# Patient Record
Sex: Male | Born: 2000 | Race: White | Hispanic: No | Marital: Single | Smoking: Never smoker
Health system: Southern US, Community
[De-identification: ages and names within clinical notes are randomized; demographics above are authoritative.]

## PROBLEM LIST (undated history)

## (undated) DIAGNOSIS — F909 Attention-deficit hyperactivity disorder, unspecified type: Secondary | ICD-10-CM

## (undated) DIAGNOSIS — R480 Dyslexia and alexia: Secondary | ICD-10-CM

---
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
## 2018-05-08 ENCOUNTER — Emergency Department (HOSPITAL_COMMUNITY): Payer: Managed Care, Other (non HMO)

## 2018-05-08 ENCOUNTER — Encounter (HOSPITAL_COMMUNITY): Payer: Self-pay

## 2018-05-08 ENCOUNTER — Emergency Department (HOSPITAL_COMMUNITY)
Admission: EM | Admit: 2018-05-08 | Discharge: 2018-05-08 | Disposition: A | Discharge status: Home / Self Care | Payer: Managed Care, Other (non HMO) | Attending: Emergency Medicine | Admitting: Emergency Medicine

## 2018-05-08 DIAGNOSIS — Z79899 Other long term (current) drug therapy: Secondary | ICD-10-CM | POA: Insufficient documentation

## 2018-05-08 DIAGNOSIS — X501XXA Overexertion from prolonged static or awkward postures, initial encounter: Secondary | ICD-10-CM | POA: Insufficient documentation

## 2018-05-08 DIAGNOSIS — S93401A Sprain of unspecified ligament of right ankle, initial encounter: Secondary | ICD-10-CM

## 2018-05-08 DIAGNOSIS — Y9351 Activity, roller skating (inline) and skateboarding: Secondary | ICD-10-CM | POA: Insufficient documentation

## 2018-05-08 DIAGNOSIS — Y999 Unspecified external cause status: Secondary | ICD-10-CM | POA: Insufficient documentation

## 2018-05-08 DIAGNOSIS — Y929 Unspecified place or not applicable: Secondary | ICD-10-CM | POA: Insufficient documentation

## 2018-05-08 DIAGNOSIS — F909 Attention-deficit hyperactivity disorder, unspecified type: Secondary | ICD-10-CM | POA: Diagnosis not present

## 2018-05-08 MED ORDER — IBUPROFEN 600 MG PO TABS: 600.0000 mg | ORAL_TABLET | Freq: Three times a day (TID) | ORAL | 0 refills | Status: AC | PRN

## 2018-05-08 MED ORDER — ACETAMINOPHEN 500 MG PO TABS
1000.0000 mg | ORAL_TABLET | Freq: Once | ORAL | Status: AC
  Administered 2018-05-08: 1000 mg via ORAL
  Filled 2018-05-08: qty 2

## 2018-05-08 NOTE — ED Provider Notes (Signed)
[facility]   CSN: [id number] Arrival date & time: 05/08/18  1217     History   Chief Complaint Chief Complaint  Patient presents with  . Ankle Injury    HPI Curlene DolphinDaniel Ostrander is a 17 y.o. male.  HPI  Patient is a 17 year old male with a history of ADD and dyslexia presenting for right ankle injury.  He reports that last night he was skateboarding and inverted his ankle.  He reports he has been minimally walking on it ever since.  He reports that the lateral aspect of his right ankle has been swollen and ecchymotic ever since injury.  He reports that he has pain with moving all of the toes of his right foot.  Denies any numbness.  Patient reports that he took ibuprofen 3 hours ago for the symptoms.  Past Medical History:  Diagnosis Date  . ADD (attention deficit disorder with hyperactivity)   . Dyslexia     Patient Active Problem List   Diagnosis Date Noted  . Injury of left elbow 10/09/2014  . ADD (attention deficit disorder) 01/19/2013    History reviewed. No pertinent surgical history.      Home Medications    Prior to Admission medications   Medication Sig Start Date End Date Taking? Authorizing Provider  ibuprofen (ADVIL,MOTRIN) 600 MG tablet Take 1 tablet (600 mg total) by mouth every 8 (eight) hours as needed for headache or moderate pain. 12/19/17   Shaune PollackIsaacs, Cameron, MD  ondansetron (ZOFRAN ODT) 4 MG disintegrating tablet Take 1 tablet (4 mg total) by mouth every 8 (eight) hours as needed for nausea or vomiting. 12/19/17   Shaune PollackIsaacs, Cameron, MD    Family History No family history on file.  Social History Social History   Tobacco Use  . Smoking status: Never Smoker  Substance Use Topics  . Alcohol use: Not on file  . Drug use: Not on file     Allergies   No known allergies   Review of Systems Review of Systems  Musculoskeletal: Positive for arthralgias and joint swelling.  Skin: Positive for color  change.  Neurological: Negative for weakness and numbness.     Physical Exam Updated Vital Signs BP 134/63 (BP Location: Right Arm)   Pulse (!) 110   Temp 98.1 F (36.7 C) (Oral)   Resp 17   Ht 5\' 8"  (1.727 m)   Wt 61.7 kg   SpO2 99%   BMI 20.68 kg/m   Physical Exam Vitals signs and nursing note reviewed.  Constitutional:      General: He is not in acute distress.    Appearance: He is well-developed. He is not diaphoretic.     Comments: Sitting comfortably in bed.  HENT:     Head: Normocephalic and atraumatic.  Eyes:     General:        Right eye: No discharge.        Left eye: No discharge.     Conjunctiva/sclera: Conjunctivae normal.     Comments: EOMs normal to gross examination.  Neck:     Musculoskeletal: Normal range of motion.  Cardiovascular:     Rate and Rhythm: Normal rate and regular rhythm.     Comments: Intact, 2+ radial pulse. Abdominal:     General: There is no distension.  Musculoskeletal:        General: Swelling present.     Comments: Right ankle with tenderness to palpation of lateral malleolus.  Soft tissue swelling  over the lateral malleolus. ROM with flexion/extension/inversion/eversion decreased with due to pain. Ecchymosis present. No erythema or deformity appreciated. No break in skin. No pain to fifth metatarsal area or navicular region.  No proximal fibular tenderness.  Achilles intact per Thompson's test. Good pedal pulse and cap refill of toes. Sensation intact to light touch distally.   Skin:    General: Skin is warm and dry.  Neurological:     Mental Status: He is alert.     Comments: Cranial nerves intact to gross observation. Patient moves extremities without difficulty.  Psychiatric:        Behavior: Behavior normal.        Thought Content: Thought content normal.        Judgment: Judgment normal.      ED Treatments / Results  Labs (all labs ordered are listed, but only abnormal results are displayed) Labs Reviewed - No  data to display  EKG None  Radiology No results found.  Procedures Procedures (including critical care time)  Medications Ordered in ED Medications  acetaminophen (TYLENOL) tablet 1,000 mg (1,000 mg Oral Given 05/08/18 1231)     Initial Impression / Assessment and Plan / ED Course  I have reviewed the triage vital signs and the nursing notes.  Pertinent labs & imaging results that were available during my care of the patient were reviewed by me and considered in my medical decision making (see chart for details).     Patient well-appearing and neurovascularly intact in the right lower extremity.  Patient with ankle injury yesterday.  Radiographs demonstrate soft tissue swelling, but no acute bony abnormality.  Patient has stable ankle mortise on exam. Suspect ankle sprain.  Will place patient in ASO splint, and weightbearing as tolerated.  Patient given orthopedic follow-up.  Patient instructed on RICE therapy.  Return precautions given for any increasing pain, swelling, pallor or paresthesias.  Patient is in understanding and agrees with the plan of care.  Final Clinical Impressions(s) / ED Diagnoses   Final diagnoses:  Sprain of right ankle, unspecified ligament, initial encounter    ED Discharge Orders         Ordered    ibuprofen (ADVIL,MOTRIN) 600 MG tablet  Every 8 hours PRN     05/08/18 1451           Elisha PonderMurray, Gurdeep Keesey B, PA-C 05/08/18 1504    Doug SouJacubowitz, Sam, MD 05/08/18 [phone]

## 2018-05-08 NOTE — Discharge Instructions (Signed)
Please see the information and instructions below regarding your visit.  Your diagnoses today include:  1. Sprain of right ankle, unspecified ligament, initial encounter    Your provider has diagnosed you as suffering from an ankle sprain. Ankle sprain occurs when the ligaments that hold the ankle joint together are stretched or torn. It may take 4 to 6 weeks to heal.  Tests performed today include: An x-ray of your ankle - does NOT show any broken bones  See side panel of your discharge paperwork for testing performed today. Vital signs are listed at the bottom of these instructions.   Medications prescribed:  Take any prescribed medications only as prescribed, and any over the counter medications only as directed on the packaging.  You are prescribed ibuprofen, a non-steroidal anti-inflammatory agent (NSAID) for pain. You may take 600 mg every 6  hours as needed for pain. If still requiring this medication around the clock for acute pain after 10 days, please see your primary healthcare provider.  Women who are pregnant, breastfeeding, or planning on becoming pregnant should not take non-steroidal anti-inflammatories such as Advil and Aleve. Tylenol is a safe over the counter pain reliever in pregnant women.  You may combine this medication with Tylenol, 650 mg every 6 hours, so you are receiving something for pain every 3 hours.  This is not a long-term medication unless under the care and direction of your primary provider. Taking this medication long-term and not under the supervision of a healthcare provider could increase the risk of stomach ulcers, kidney problems, and cardiovascular problems such as high blood pressure.    Home care instructions:  Follow R.I.C.E. Protocol: R - rest your injury  I  - use ice on injury without applying directly to skin C - compress injury with bandage or splint E - elevate the injury as much as possible  For Activity: Wear ankle brace for at  least 2 weeks for stabilization of ankle. If prescribed crutches, use crutches with non-weight bearing for the first few days. Then, you may walk on your ankle as the pain allows, or as instructed. Start gradually with weight bearing on the affected ankle. Once you can walk pain free, then try jogging. When you can run forwards, then you can try moving side-to-side. If you cannot walk without crutches in one week, you need a re-check.  Please follow any educational materials contained in this packet.   Follow-up instructions: Please follow-up with your primary care provider or the provided orthopedic (bone specialist) listed in this packet if you continue to have significant pain or trouble walking in 1 week. In this case you may have a severe sprain that requires further care.   Return instructions:  Please return if your toes are numb or tingling, appear gray or blue, are much colder than your other foot, or you have severe pain (also elevate leg and loosen splint or wrap). Please return to the Emergency Department if you experience worsening symptoms.  Please return if you have any other emergent concerns.  Additional Information:   Your vital signs today were: BP 109/87 (BP Location: Right Arm)    Pulse 72    Temp 98.1 F (36.7 C) (Oral)    Resp 17    Ht 5\' 8"  (1.727 m)    Wt 61.7 kg    SpO2 99%    BMI 20.68 kg/m  If your blood pressure (BP) was elevated on multiple readings during this visit above 130 for the top  number or above 80 for the bottom number, please have this repeated by your primary care provider within one month. --------------  Thank you for allowing us to participate in your care today.

## 2018-05-08 NOTE — ED Triage Notes (Signed)
Pt injured right ankle last night while skateboarding. Swelling noted.

## 2019-04-28 IMAGING — CR DG CERVICAL SPINE COMPLETE 4+V
6 series · 6 of 6 positions shown · non-contrast
Comparison: None.

CLINICAL DATA: Patient fell while skateboarding and complains of
neck pain. Hematoma to the posterior head.

EXAM:
CERVICAL SPINE - COMPLETE 4+ VIEW

[w cervical spine lat]
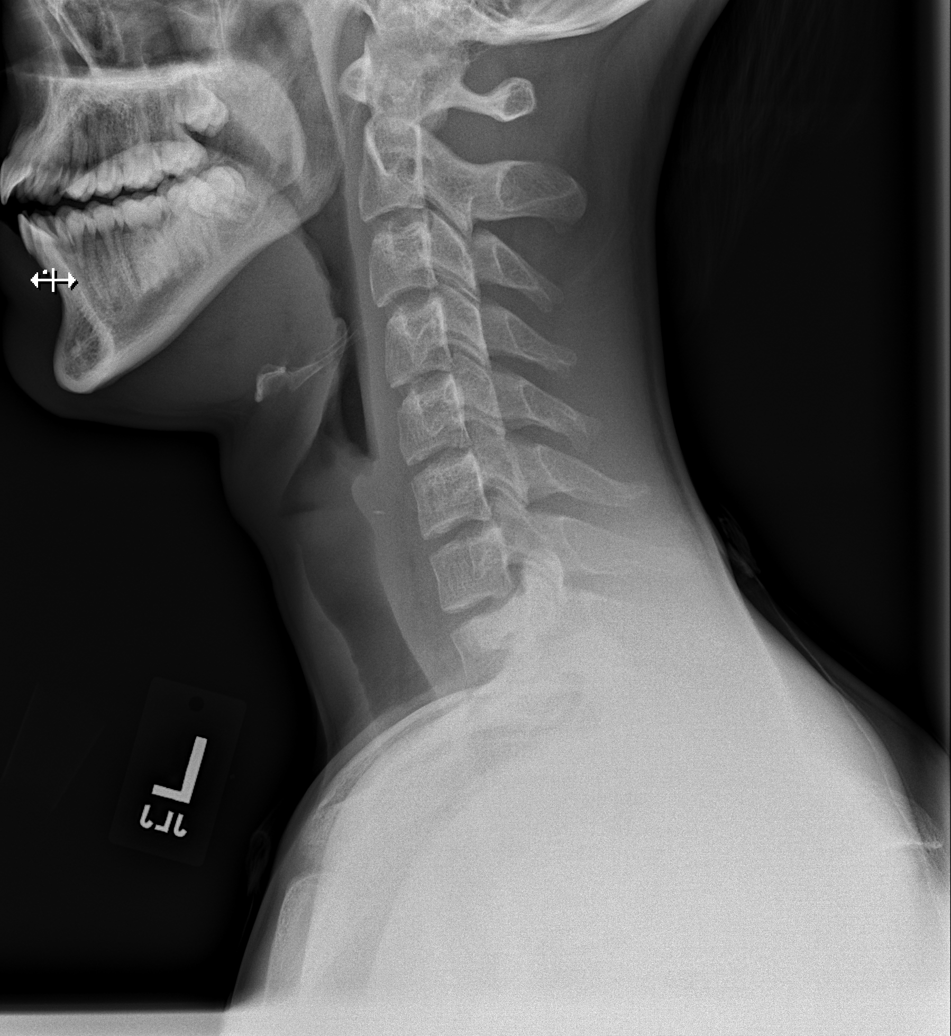

[w cervical spine ap_obl (1 of 2)]
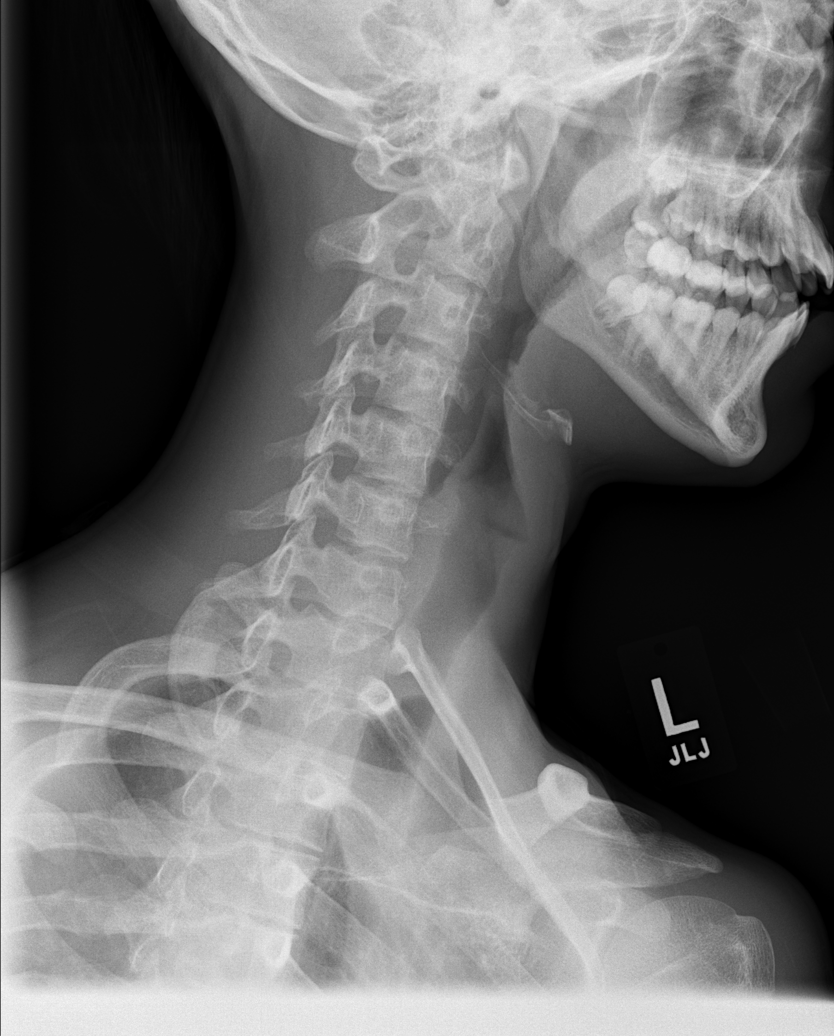

[w cervical spine ap_obl (2 of 2)]
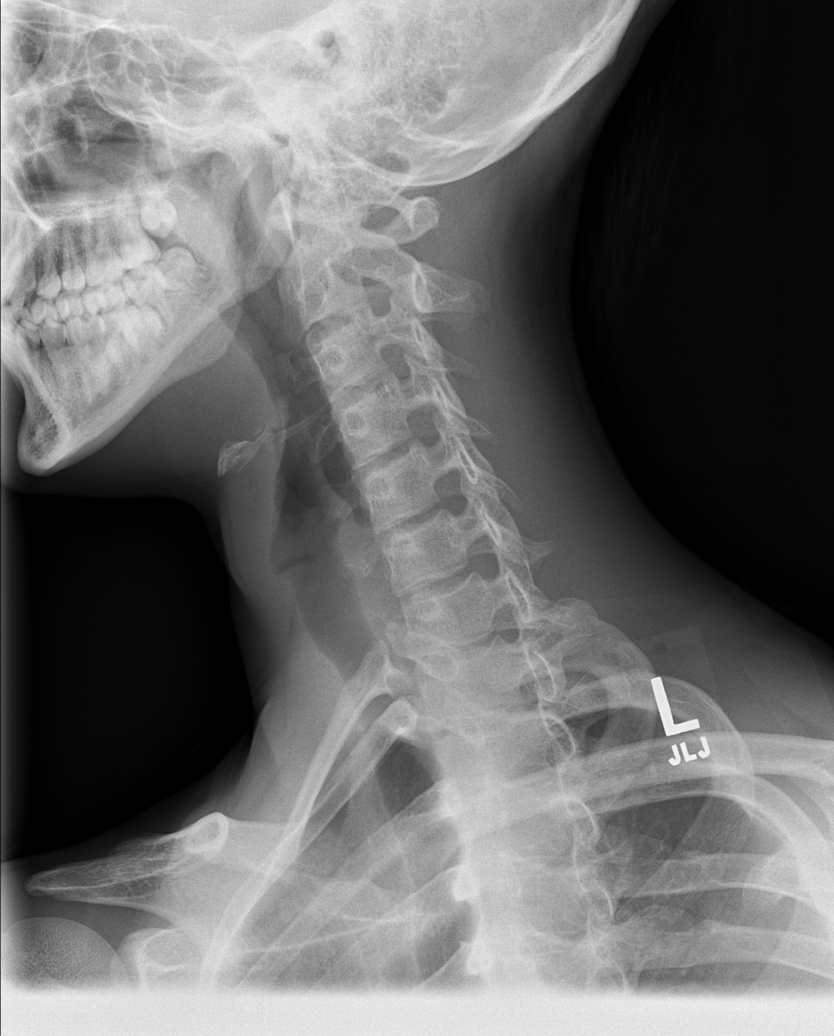

[w cervical spine ap]
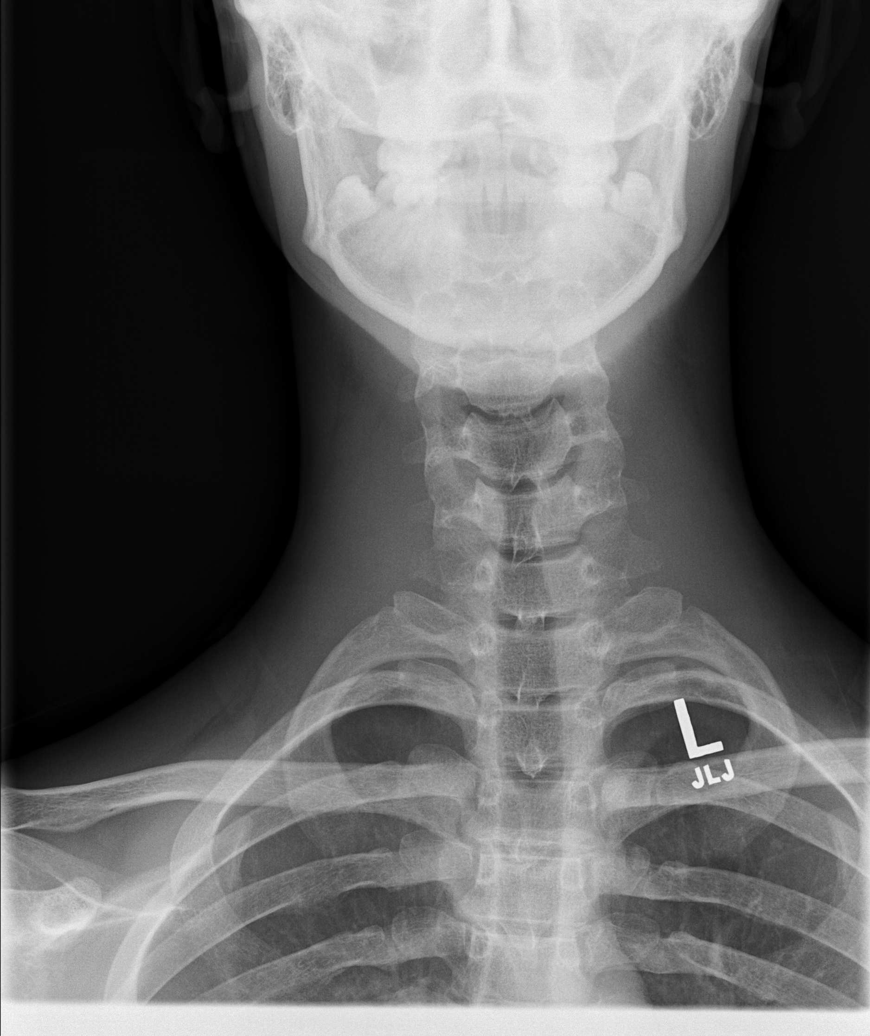

[w cervical spine odontoid (1 of 2)]
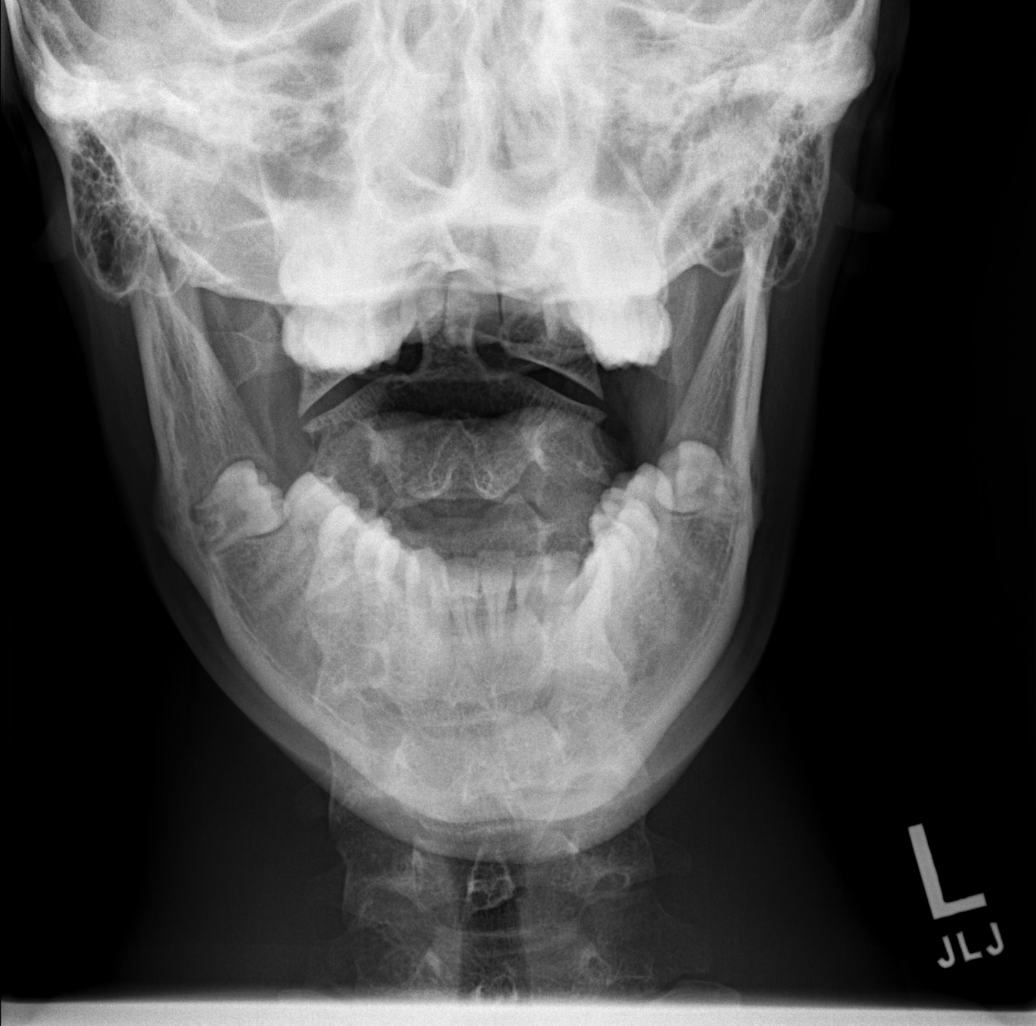

[w cervical spine odontoid (2 of 2)]
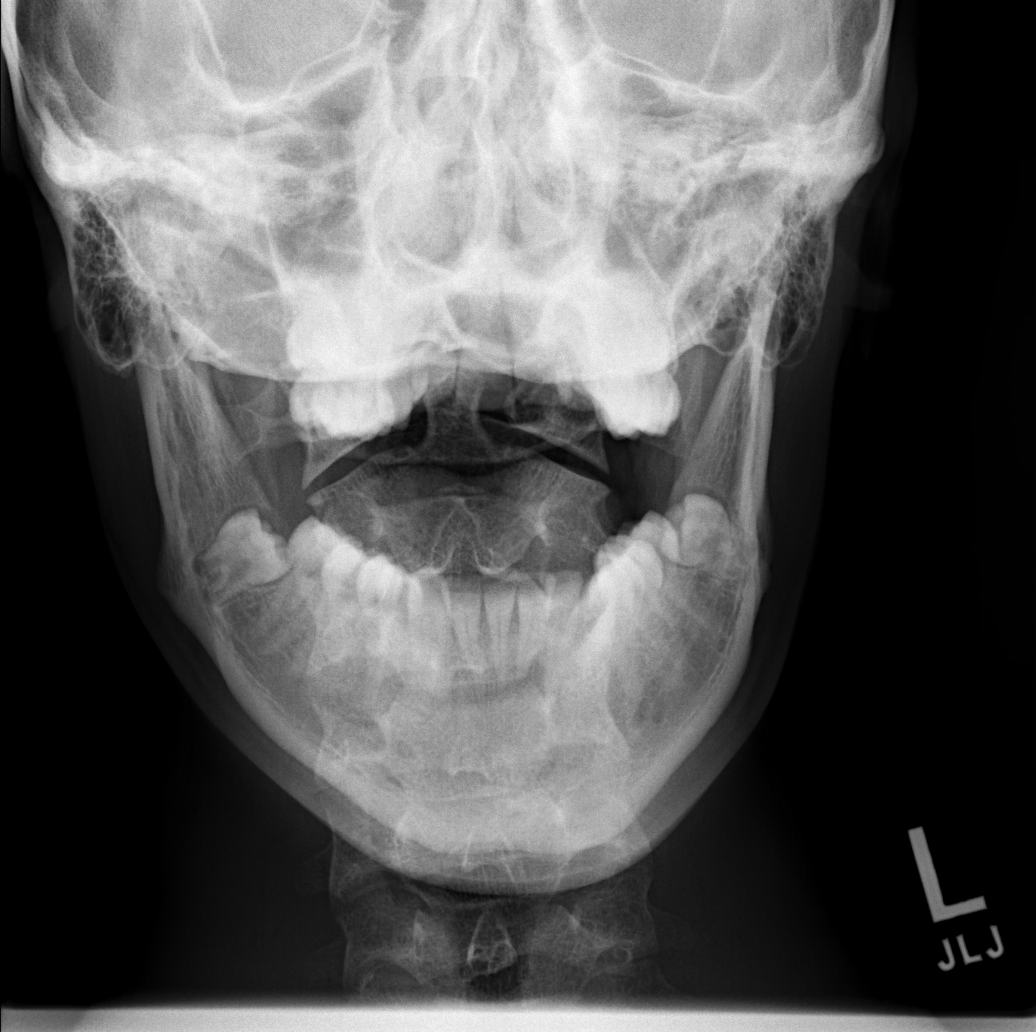

[6 of 6 positions shown; findings below may reference images not displayed]

FINDINGS: There is no evidence of cervical spine fracture or prevertebral soft
tissue swelling. Alignment is normal. No other significant bone
abnormalities are identified. The included lung apices are clear
without pneumothorax or pulmonary consolidations. The included
clavicles appear intact without dislocation.
IMPRESSION: Negative cervical spine radiographs.

## 2019-09-15 IMAGING — CR DG ANKLE COMPLETE 3+V*R*
3 series · 3 of 3 positions shown · non-contrast
Comparison: None.

CLINICAL DATA: Ankle injury while skateboarding

EXAM:
Normal alignment RIGHT ANKLE - COMPLETE 3+ VIEW

[ankle ap]
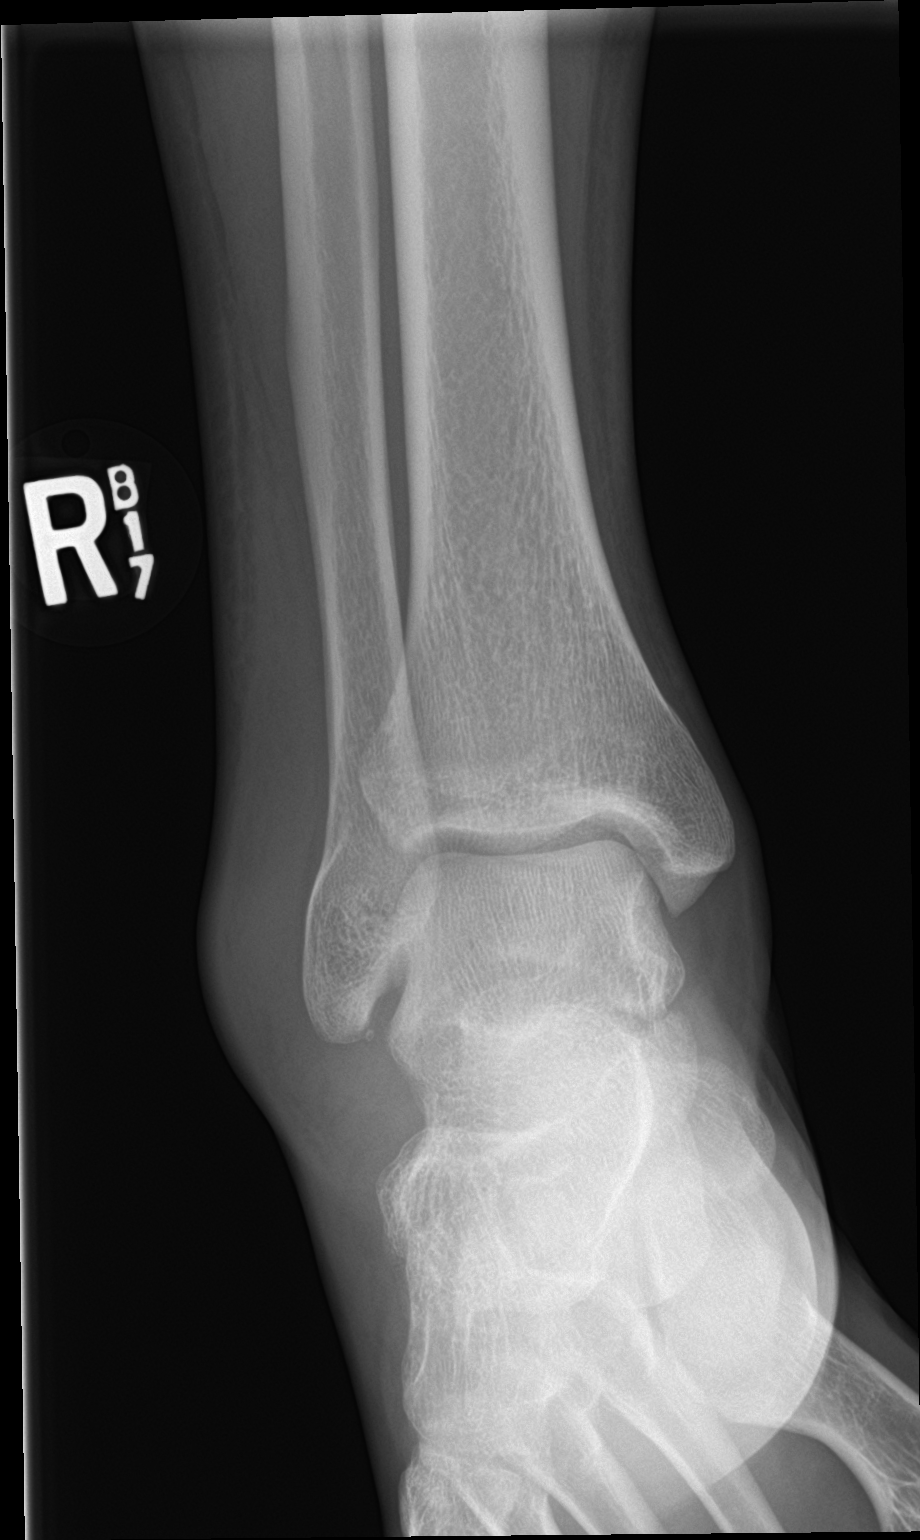

[ankle obl]
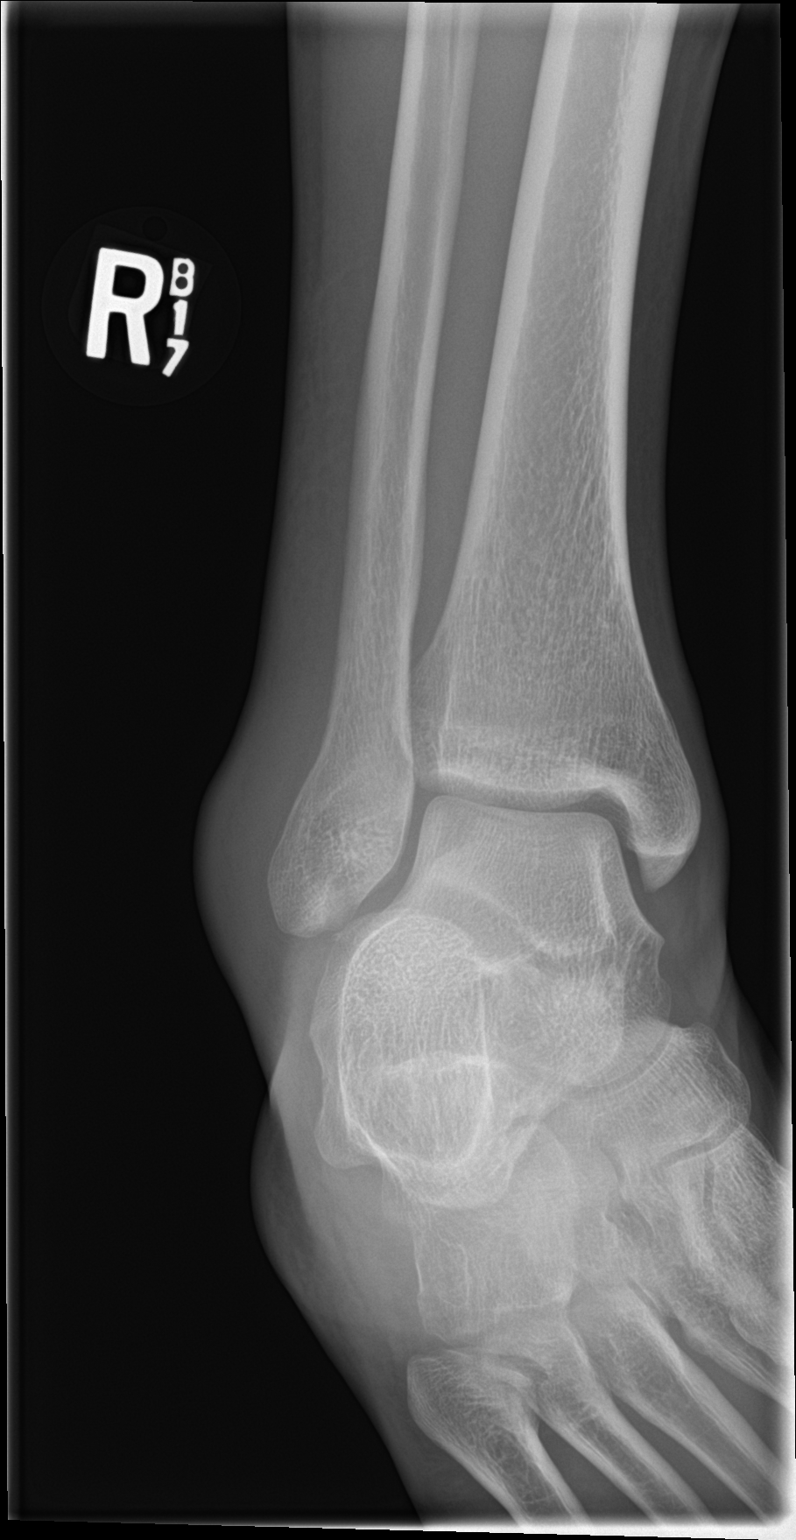

[ankle lat]
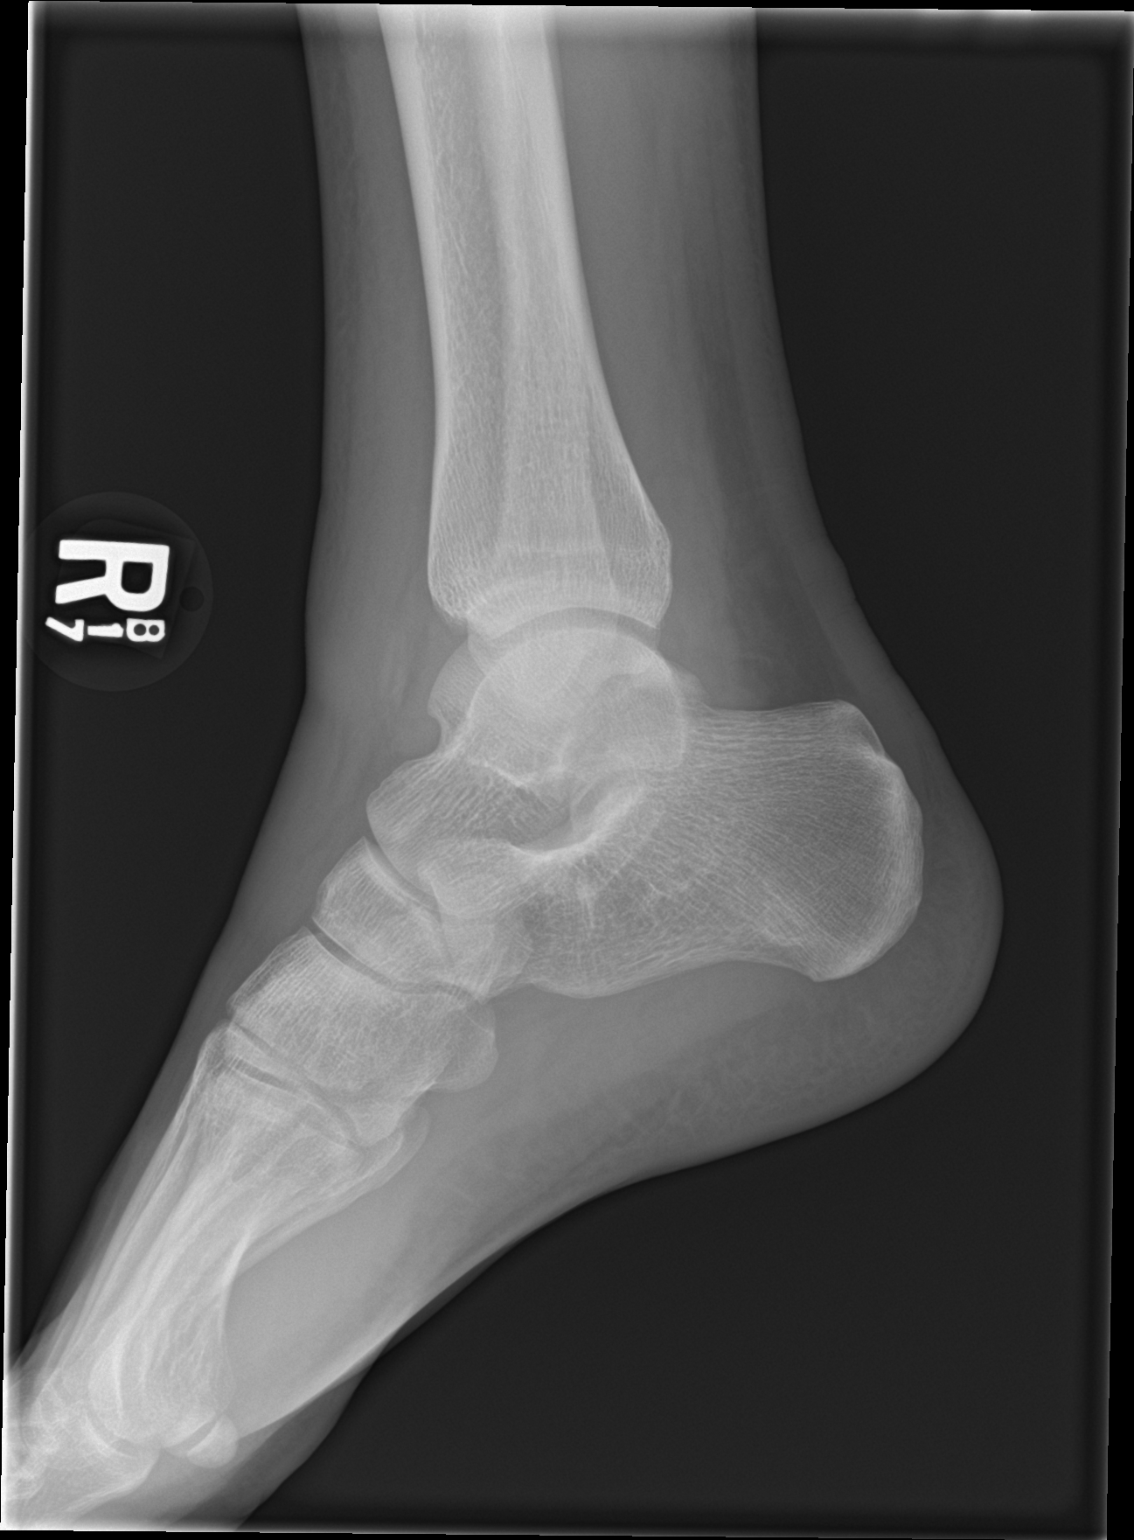

[3 of 3 positions shown; findings below may reference images not displayed]

FINDINGS: No fracture.

Lateral soft tissue swelling.  Ankle joint effusion.
IMPRESSION: Ankle effusion.  Negative for fracture.

## 2024-01-18 ENCOUNTER — Other Ambulatory Visit: Payer: Self-pay

## 2024-01-18 ENCOUNTER — Encounter (HOSPITAL_BASED_OUTPATIENT_CLINIC_OR_DEPARTMENT_OTHER): Payer: Self-pay | Admitting: Emergency Medicine

## 2024-01-18 ENCOUNTER — Emergency Department (HOSPITAL_BASED_OUTPATIENT_CLINIC_OR_DEPARTMENT_OTHER)
Admission: EM | Admit: 2024-01-18 | Discharge: 2024-01-18 | Disposition: A | Discharge status: Home / Self Care | Payer: Self-pay | Attending: Emergency Medicine | Admitting: Emergency Medicine

## 2024-01-18 ENCOUNTER — Emergency Department (HOSPITAL_BASED_OUTPATIENT_CLINIC_OR_DEPARTMENT_OTHER): Payer: Self-pay | Admitting: Radiology

## 2024-01-18 DIAGNOSIS — S6992XA Unspecified injury of left wrist, hand and finger(s), initial encounter: Secondary | ICD-10-CM | POA: Insufficient documentation

## 2024-01-18 DIAGNOSIS — Y9351 Activity, roller skating (inline) and skateboarding: Secondary | ICD-10-CM | POA: Insufficient documentation

## 2024-01-18 NOTE — Discharge Instructions (Addendum)
 Take ibuprofen  as needed for swelling and pain.  Follow-up with orthopedics for further recommendations.  If pain worsens or new symptoms arise return to ED for further evaluation.

## 2024-01-18 NOTE — ED Triage Notes (Signed)
 Was skate boarding yest and fell on left wrist  now swollen and very  Painful ,good pulser, > 3 sec refill can wiggle fingers

## 2024-01-18 NOTE — ED Provider Notes (Signed)
 [place] EMERGENCY DEPARTMENT AT [facility] Provider Note   CSN: [id number] Arrival date & time: 01/18/24  1657     Patient presents with: Wrist Pain   Alvin Ford is a 23 y.o. male.  23 year old male presents to ED with complaints of left medial wrist pain after skateboard injury yesterday.  Patient advises he was skateboarding and caught himself with his left hand in an extended position against an inclined surface.  Patient reports he never actually fell and he noticed pain after the injury.  Patient denies any head injury.  Patient has full sensation to all fingers.  Cap refills appropriate in all fingers.  He is able to move his fingers and his wrist.  There is limited range of motion of his wrist.     Prior to Admission medications   Medication Sig Start Date End Date Taking? Authorizing Provider  ibuprofen  (ADVIL ,MOTRIN ) 600 MG tablet Take 1 tablet (600 mg total) by mouth every 8 (eight) hours as needed for headache or moderate pain. 05/08/18   Murray, Alyssa B, PA-C  ondansetron  (ZOFRAN  ODT) 4 MG disintegrating tablet Take 1 tablet (4 mg total) by mouth every 8 (eight) hours as needed for nausea or vomiting. 12/19/17   Angelena Smalls, MD    Allergies: No known allergies    Review of Systems  Musculoskeletal:  Positive for joint swelling.  All other systems reviewed and are negative.   Updated Vital Signs BP (!) 148/76 (BP Location: Right Arm)   Pulse 80   Temp 98.6 F (37 C) (Oral)   Resp 16   Ht 5' 8 (1.727 m)   Wt 81.6 kg   SpO2 99%   BMI 27.37 kg/m   Physical Exam Vitals and nursing note reviewed.  Constitutional:      Appearance: Normal appearance.  HENT:     Head: Normocephalic and atraumatic.     Nose: Nose normal.  Eyes:     Extraocular Movements: Extraocular movements intact.     Conjunctiva/sclera: Conjunctivae normal.     Pupils: Pupils are equal, round, and reactive to light.  Cardiovascular:     Rate and Rhythm: Normal rate.   Pulmonary:     Effort: Pulmonary effort is normal. No respiratory distress.  Musculoskeletal:     Right wrist: Normal.     Left wrist: Swelling and tenderness present. No deformity, lacerations or snuff box tenderness. Decreased range of motion.     Cervical back: Normal range of motion.  Neurological:     General: No focal deficit present.     Mental Status: He is alert.  Psychiatric:        Mood and Affect: Mood normal.        Behavior: Behavior normal.     (all labs ordered are listed, but only abnormal results are displayed) Labs Reviewed - No data to display  EKG: None  Radiology: DG Wrist Complete Left Result Date: 01/18/2024 CLINICAL DATA:  Status post fall EXAM: DG WRIST COMPLETE four views COMPARISON:  None Available. FINDINGS: Osseous fragment along the posterior aspect of the carpal bone at the level of the trapezium/piriformis may represent a chip fracture. Remainder of the osseous structures are unremarkable. Soft tissue swelling is identified along the dorsum of the carpus. IMPRESSION: Osseous fragment along the posterior aspect of the carpal bones on lateral view with associated overlying soft tissue swelling may represent a chip fracture. Recommend CT for further characterization if clinically warranted. Electronically Signed   By: Megan  Zare  M.D.   On: 01/18/2024 19:01    Procedures   Medications Ordered in the ED - No data to display  23 y.o. male presents to the ED with complaints of left hand injury, this involves an extensive number of treatment options, and is a complaint that carries with it a high risk of complications and morbidity.  The differential diagnosis includes fracture, dislocation, muscle strain, (Ddx)  On arrival pt is nontoxic, vitals slightly hypertensive, no other abnormalities. Exam significant for swelling in the left wrist with decreased range of motion.  Additional history obtained from chart review. Previous records obtained and reviewed  injuries associated to skateboarding in the past and follow-ups with [facility].   Imaging Studies ordered:  I ordered imaging studies which included complete left wrist xray, I independently visualized and interpreted imaging which showed possible posterior chip fracture on the carpal bones.  ED Course:   Patient is sitting comfortably in ED bed in no obvious distress.  Patient has some minor swelling around the left wrist and a little bit of decreased range of motion.  He has no loss of sensation in any of his fingers.  Good pulses in his wrist.  He is able to move his fingers appropriately and able to move his thumb appropriately and touch all fingers.  There is no obvious dislocation.  Patient has no pain to palpation in his elbow shoulder or clavicle.  Patient was advised of findings on x-ray and advised to follow-up with orthopedic for further recommendation.  Patient has a new hobby involving bodybuilding and heavy lifting he was requesting information regarding still doing this hobby with injury.  He was advised to stop doing activity until further recommendation from orthopedic and injury has been fully healed.  Patient agreed with treatment plan and denied any medication in the ED or at discharge.     Portions of this note were generated with Scientist, clinical (histocompatibility and immunogenetics). Dictation errors may occur despite best attempts at proofreading.   Final diagnoses:  Hand injury, left, initial encounter    ED Discharge Orders     None          Myriam Fonda GORMAN DEVONNA 01/18/24 2054    Doretha Folks, MD 01/20/24 2100

## 2024-01-19 ENCOUNTER — Ambulatory Visit (INDEPENDENT_AMBULATORY_CARE_PROVIDER_SITE_OTHER): Payer: Self-pay | Admitting: Orthopaedic Surgery

## 2024-01-19 DIAGNOSIS — M25532 Pain in left wrist: Secondary | ICD-10-CM | POA: Insufficient documentation

## 2024-01-19 NOTE — Progress Notes (Signed)
 Office Visit Note   Patient: Alvin Ford           Date of Birth: August 03, 2000           MRN: [id number] Visit Date: 01/19/2024              Requested by: Almarie Camellia ORN, MD No address on file PCP: Almarie Camellia ORN, MD   Assessment & Plan: Visit Diagnoses:  1. Pain in left wrist     Plan: History of Present Illness Alvin Ford is a 23 year old male who presents with left hand pain following a skateboarding injury. He was referred by the ER for further evaluation of a chip fracture in his left hand.  He sustained the injury yesterday while skateboarding, resulting in hyperextension of the left hand. X-rays from the ER show an avulsion fracture. Swelling is present on the dorsal aspect of the hand with localized pain.  He is involved in weight training and takes supplements that contraindicate the use of ibuprofen  and Tylenol . He seeks alternative pain management options.  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  Physical Exam EXTREMITIES: Swelling and mild tenderness on dorsal left wrist. No tenderness in left snuff box or over left radial styloid. NEUROLOGICAL: Sensation intact in left fingertips.  Results RADIOLOGY Left hand X-ray: Avulsion fracture of triquetrum bone (01/18/2024)  Assessment and Plan Left wrist triquetral avulsion fracture Conservative management recommended to prevent stiffness and promote healing. - Provide wrist brace - Advise regular brace use initially, then wean off as symptoms improve. - Recommend Tylenol  for pain, considering supplement interactions. - Advise against hard cast to prevent stiffness. - Instruct to ice and elevate wrist to reduce swelling. - Advise avoiding pain-exacerbating activities, use symptoms to guide activity. - Provide work note for soft duty for six weeks.  Follow-Up Instructions: Return in about 6 weeks (around 03/01/2024).   Orders:  No orders of the  defined types were placed in this encounter.  No orders of the defined types were placed in this encounter.   Subjective: Chief Complaint  Patient presents with   Left Wrist - Pain    HPI  Review of Systems  Constitutional: Negative.   HENT: Negative.    Eyes: Negative.   Respiratory: Negative.    Cardiovascular: Negative.   Gastrointestinal: Negative.   Endocrine: Negative.   Genitourinary: Negative.   Skin: Negative.   Allergic/Immunologic: Negative.   Neurological: Negative.   Hematological: Negative.   Psychiatric/Behavioral: Negative.    All other systems reviewed and are negative.    Objective: Vital Signs: There were no vitals taken for this visit.  Physical Exam Vitals and nursing note reviewed.  Constitutional:      Appearance: He is well-developed.  HENT:     Head: Normocephalic and atraumatic.  Eyes:     Pupils: Pupils are equal, round, and reactive to light.  Pulmonary:     Effort: Pulmonary effort is normal.  Abdominal:     Palpations: Abdomen is soft.  Musculoskeletal:        General: Normal range of motion.     Cervical back: Neck supple.  Skin:    General: Skin is warm.  Neurological:     Mental Status: He is alert and oriented to person, place, and time.  Psychiatric:        Behavior: Behavior normal.        Thought Content: Thought  content normal.        Judgment: Judgment normal.     Ortho Exam  Specialty Comments:  No specialty comments available.  Imaging: DG Wrist Complete Left Result Date: 01/18/2024 CLINICAL DATA:  Status post fall EXAM: DG WRIST COMPLETE four views COMPARISON:  None Available. FINDINGS: Osseous fragment along the posterior aspect of the carpal bone at the level of the trapezium/piriformis may represent a chip fracture. Remainder of the osseous structures are unremarkable. Soft tissue swelling is identified along the dorsum of the carpus. IMPRESSION: Osseous fragment along the posterior aspect of the carpal  bones on lateral view with associated overlying soft tissue swelling may represent a chip fracture. Recommend CT for further characterization if clinically warranted. Electronically Signed   By: Megan  Zare M.D.   On: 01/18/2024 19:01     PMFS History: Patient Active Problem List   Diagnosis Date Noted   Pain in left wrist 01/19/2024   Injury of left elbow 10/09/2014   ADD (attention deficit disorder) 01/19/2013   Past Medical History:  Diagnosis Date   ADD (attention deficit disorder with hyperactivity)    Dyslexia     No family history on file.  No past surgical history on file. Social History   Occupational History   Not on file  Tobacco Use   Smoking status: Never   Smokeless tobacco: Not on file  Vaping Use   Vaping status: Never Used  Substance and Sexual Activity   Alcohol use: Not Currently   Drug use: Not Currently   Sexual activity: Not on file

## 2024-03-20 ENCOUNTER — Encounter: Payer: Self-pay | Admitting: Radiology
